# Patient Record
Sex: Female | Born: 1990 | Race: Black or African American | Hispanic: No | Marital: Single | Smoking: Never smoker
Health system: Southern US, Community
[De-identification: ages and names within clinical notes are randomized; demographics above are authoritative.]

---
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
## 2014-06-21 ENCOUNTER — Encounter: Payer: Self-pay | Admitting: Internal Medicine

## 2014-06-24 ENCOUNTER — Ambulatory Visit (INDEPENDENT_AMBULATORY_CARE_PROVIDER_SITE_OTHER): Payer: BLUE CROSS/BLUE SHIELD | Admitting: Internal Medicine

## 2014-06-24 DIAGNOSIS — Z23 Encounter for immunization: Secondary | ICD-10-CM | POA: Diagnosis not present

## 2014-06-24 DIAGNOSIS — Z9189 Other specified personal risk factors, not elsewhere classified: Secondary | ICD-10-CM

## 2014-06-24 MED ORDER — CIPROFLOXACIN HCL 500 MG PO TABS: 500.0000 mg | ORAL_TABLET | Freq: Two times a day (BID) | ORAL | Status: DC

## 2014-06-24 MED ORDER — ATOVAQUONE-PROGUANIL HCL 250-100 MG PO TABS: 1.0000 | ORAL_TABLET | Freq: Every day | ORAL | Status: DC

## 2014-07-01 NOTE — Progress Notes (Signed)
   Subjective:    Patient ID: Madison Mcgee, female    DOB: February 01, 1990, 24 y.o.   MRN: [id number]  HPI 24 yo F going to [place] through school program for 17 days. In good state of health.  No Known Allergies No current outpatient prescriptions on file prior to visit.   No current facility-administered medications on file prior to visit.      Review of Systems     Objective:   Physical Exam        Assessment & Plan:  Travel vaccines = will give hep A #1 as well as typhoid vaccination  Malaria prophylaxis = will give rx for malarone due to areas of travel maybe endemic  Traveler's diarrhea = gave rx for cipro to use if needed

## 2018-07-25 ENCOUNTER — Other Ambulatory Visit: Payer: Self-pay | Admitting: *Deleted

## 2018-07-25 DIAGNOSIS — Z20822 Contact with and (suspected) exposure to covid-19: Secondary | ICD-10-CM

## 2018-07-30 LAB — NOVEL CORONAVIRUS, NAA: SARS-CoV-2, NAA: NOT DETECTED

## 2019-05-14 ENCOUNTER — Other Ambulatory Visit: Payer: Self-pay | Admitting: Obstetrics and Gynecology

## 2019-05-14 DIAGNOSIS — R102 Pelvic and perineal pain: Secondary | ICD-10-CM

## 2019-05-18 ENCOUNTER — Ambulatory Visit
Admission: RE | Admit: 2019-05-18 | Discharge: 2019-05-18 | Disposition: A | Discharge status: Home / Self Care | Payer: No Typology Code available for payment source | Source: Ambulatory Visit | Attending: Obstetrics and Gynecology | Admitting: Obstetrics and Gynecology

## 2019-05-18 DIAGNOSIS — R102 Pelvic and perineal pain: Secondary | ICD-10-CM

## 2019-12-25 ENCOUNTER — Other Ambulatory Visit: Payer: BLUE CROSS/BLUE SHIELD

## 2019-12-25 ENCOUNTER — Other Ambulatory Visit: Payer: Self-pay

## 2019-12-25 DIAGNOSIS — Z20822 Contact with and (suspected) exposure to covid-19: Secondary | ICD-10-CM

## 2019-12-27 LAB — SARS-COV-2, NAA 2 DAY TAT

## 2019-12-27 LAB — NOVEL CORONAVIRUS, NAA: SARS-CoV-2, NAA: NOT DETECTED

## 2019-12-27 LAB — SPECIMEN STATUS REPORT

## 2020-07-01 IMAGING — US US PELVIS COMPLETE WITH TRANSVAGINAL
1 series · 14 of 25 positions shown · non-contrast
Comparison: None

CLINICAL DATA: Perineal neuralgia of unspecified laterality,
vaginal pain at for 4-5 months, LMP 05/02/2019

EXAM:
TRANSABDOMINAL AND TRANSVAGINAL ULTRASOUND OF PELVIS
TECHNIQUE: Both transabdominal and transvaginal ultrasound examinations of the
pelvis were performed. Transabdominal technique was performed for
global imaging of the pelvis including uterus, ovaries, adnexal
regions, and pelvic cul-de-sac. It was necessary to proceed with
endovaginal exam following the transabdominal exam to visualize the
endometrium and ovaries.

[Series 1: us pelvis complete with transvaginal · 0.20mm/px · 14 of 58 slices shown]
[im 1/58]
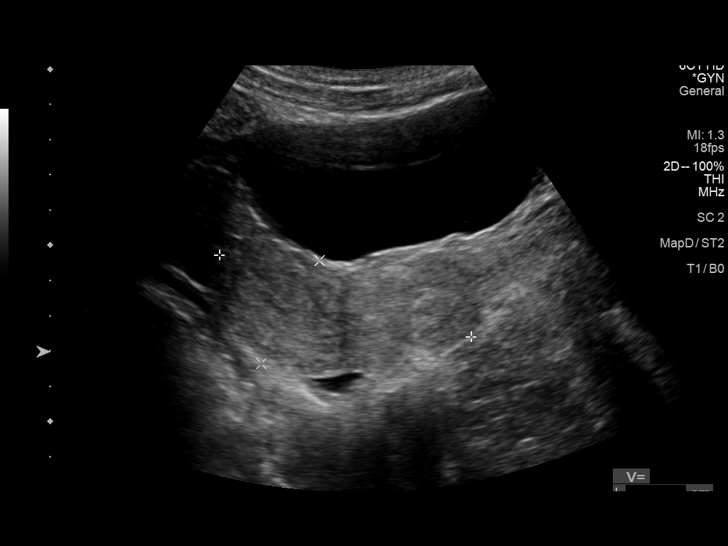
[im 5/58]
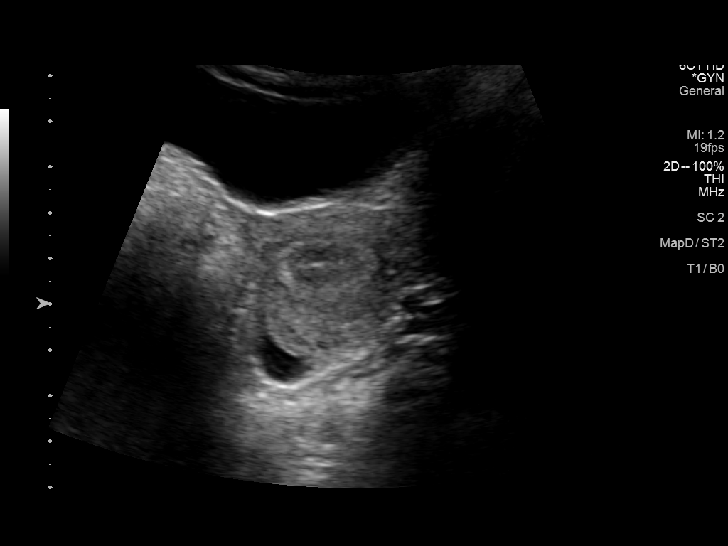
[im 10/58]
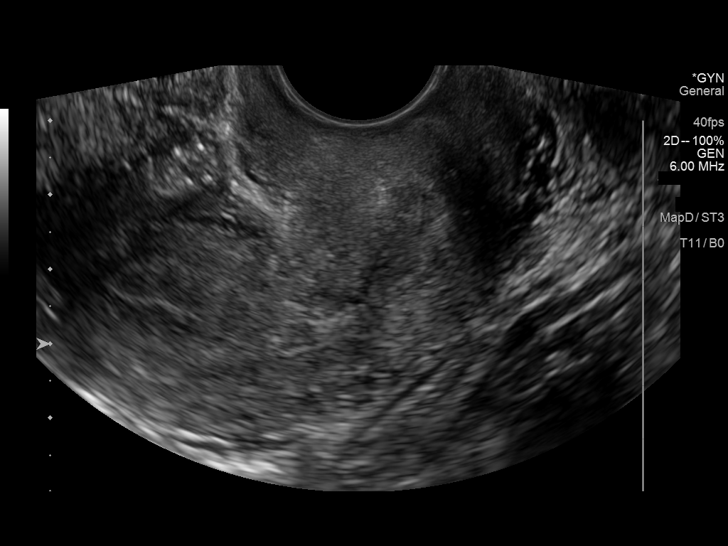
[im 15/58]
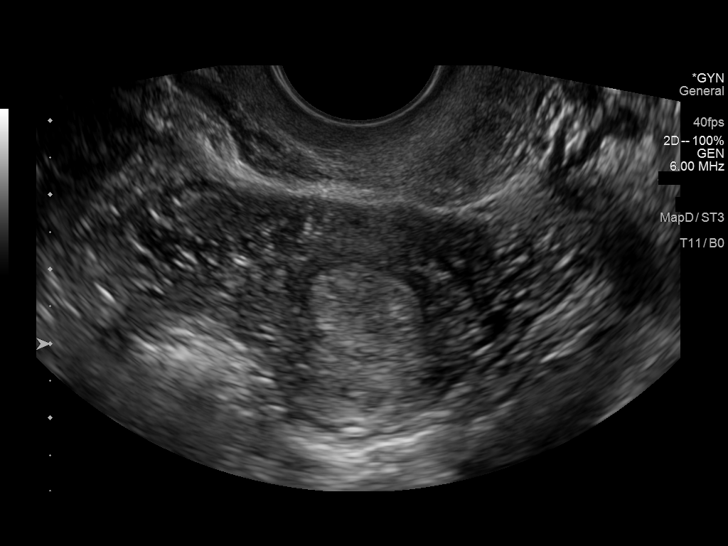
[im 20/58]
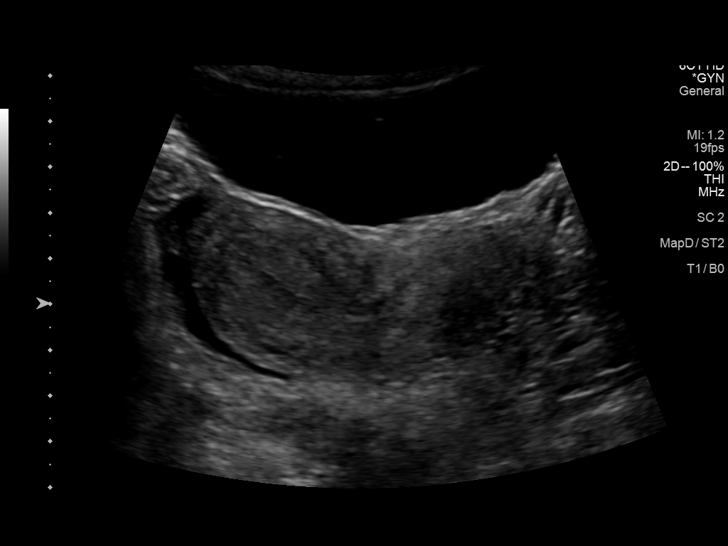
[im 22/58]
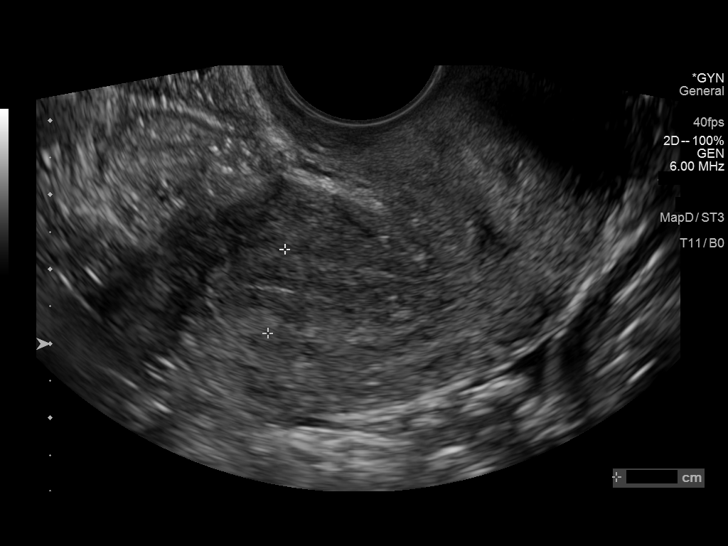
[im 27/58]
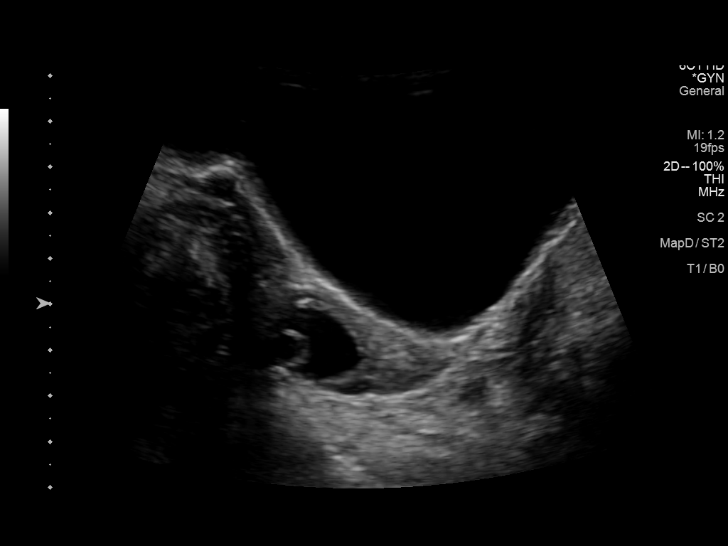
[im 31/58]
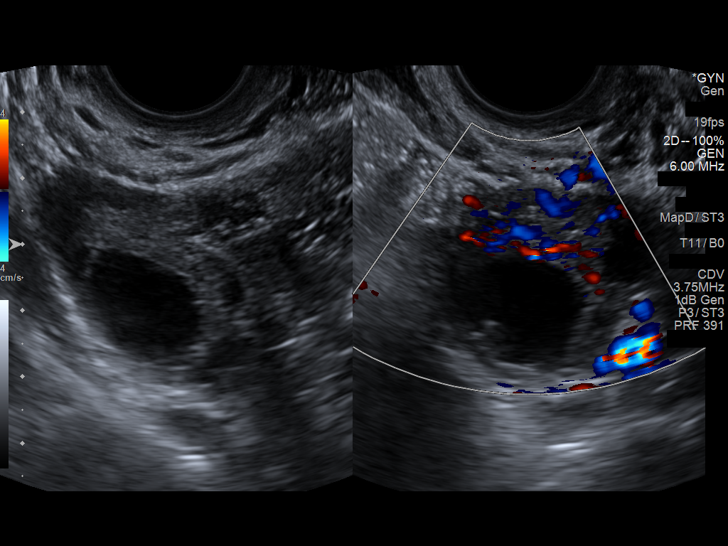
[im 36/58]
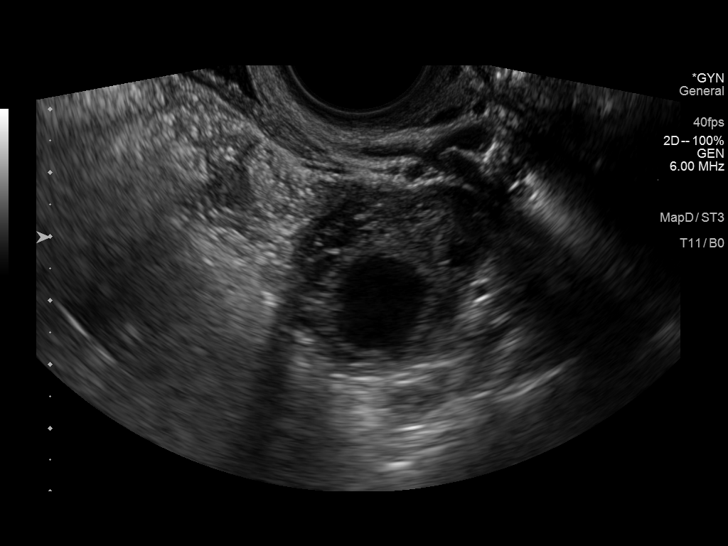
[im 39/58]
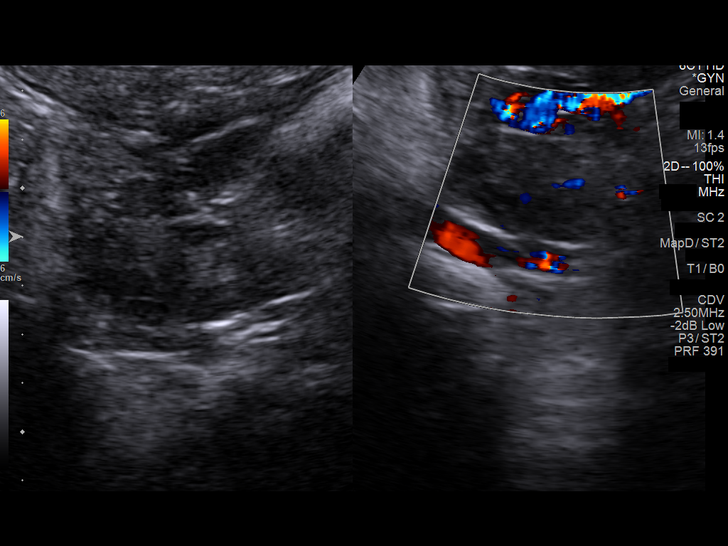
[im 43/58]
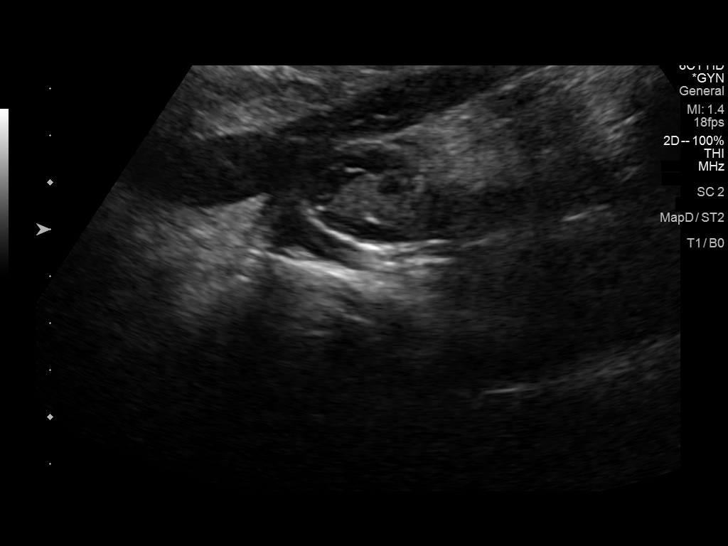
[im 48/58]
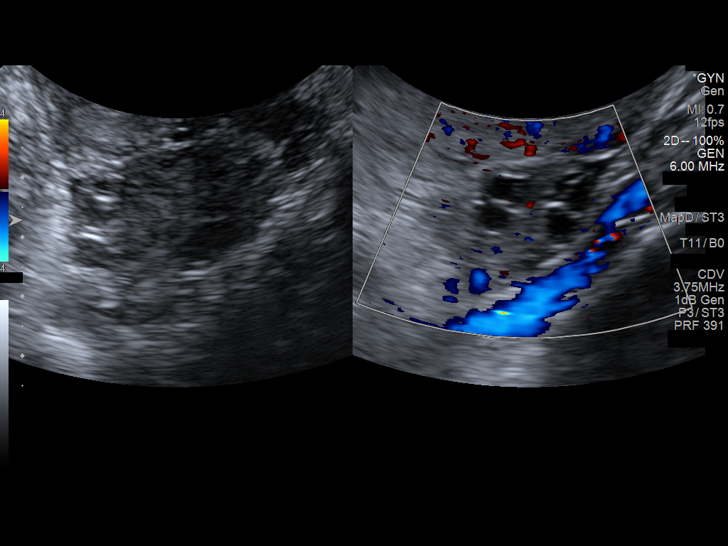
[im 53/58]
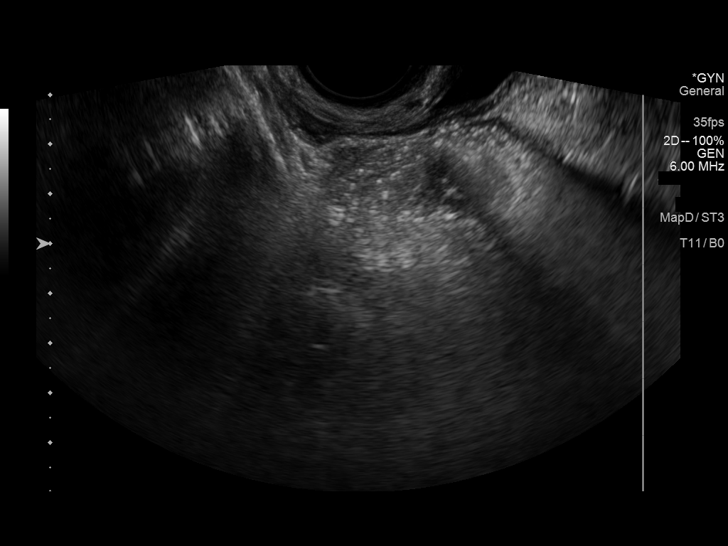
[im 58/58]
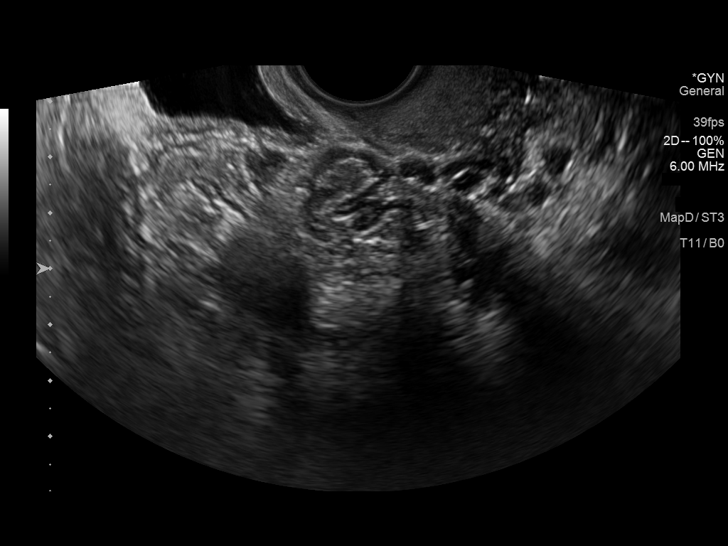

[14 of 25 positions shown; findings below may reference images not displayed]

FINDINGS: Uterus

Measurements: 7.5 x 3.4 x 4.7 cm = volume: 62 mL. Anteverted. Mildly
heterogeneous myometrium. No focal mass.

Endometrium

Thickness: 12 mm.  No endometrial fluid or focal abnormality

Right ovary

Measurements: 3.5 x 2.1 x 4.0 cm = volume: 14 mL. Dominant follicle
without mass

Left ovary

Measurements: 4.4 x 2.6 x 3.0 cm = volume: 18 mL. Normal morphology
without mass.

Other findings

Trace free pelvic fluid.  No adnexal masses.
IMPRESSION: No pelvic sonographic abnormalities identified.

## 2020-07-21 ENCOUNTER — Encounter: Payer: Self-pay | Admitting: Allergy

## 2020-07-21 ENCOUNTER — Ambulatory Visit: Payer: BC Managed Care – PPO | Admitting: Allergy

## 2020-07-21 ENCOUNTER — Other Ambulatory Visit: Payer: Self-pay

## 2020-07-21 VITALS — BP 88/50 | HR 106 | Temp 98.8°F | Resp 18 | Ht 67.0 in | Wt 105.0 lb

## 2020-07-21 DIAGNOSIS — Z8489 Family history of other specified conditions: Secondary | ICD-10-CM | POA: Diagnosis not present

## 2020-07-21 DIAGNOSIS — H1013 Acute atopic conjunctivitis, bilateral: Secondary | ICD-10-CM

## 2020-07-21 DIAGNOSIS — J3089 Other allergic rhinitis: Secondary | ICD-10-CM

## 2020-07-21 DIAGNOSIS — Z8709 Personal history of other diseases of the respiratory system: Secondary | ICD-10-CM | POA: Diagnosis not present

## 2020-07-21 DIAGNOSIS — K9049 Malabsorption due to intolerance, not elsewhere classified: Secondary | ICD-10-CM

## 2020-07-21 MED ORDER — OLOPATADINE HCL 0.2 % OP SOLN: 1.0000 [drp] | Freq: Every day | OPHTHALMIC | 3 refills | Status: AC | PRN

## 2020-07-21 MED ORDER — ALBUTEROL SULFATE HFA 108 (90 BASE) MCG/ACT IN AERS: 2.0000 | INHALATION_SPRAY | RESPIRATORY_TRACT | 1 refills | Status: AC | PRN

## 2020-07-21 MED ORDER — EPINEPHRINE 0.3 MG/0.3ML IJ SOAJ: 0.3000 mg | INTRAMUSCULAR | 2 refills | Status: AC | PRN

## 2020-07-21 NOTE — Assessment & Plan Note (Signed)
Noted bloating, acne, dry hair and fatigue with gluten. Less severe symptoms with dairy. Denies any other associated symptoms.  Discussed with patient at length that skin prick testing and bloodwork (food IgE levels) check for IgE mediated reactions which her clinical presentation does not support.   No indication for any food skin testing today.   You may continue to avoid gluten and dairy if it makes you feel better.

## 2020-07-21 NOTE — Assessment & Plan Note (Signed)
History of exercise induced asthma and used to have albuterol. Noted that breathing is not back to baseline since had Covid-19.  Today's spirometry was normal with no improvement in FEV1 post bronchodilator treatment. Clinically feeling unchanged. . May use albuterol rescue inhaler 2 puffs every 4 to 6 hours as needed for shortness of breath, chest tightness, coughing, and wheezing. May use albuterol rescue inhaler 2 puffs 5 to 15 minutes prior to strenuous physical activities. Monitor frequency of use.

## 2020-07-21 NOTE — Progress Notes (Signed)
il  New Patient Note  RE: Madison Mcgee MRN: [id number] DOB: 02-10-1990 Date of Office Visit: 07/21/2020  Consult requested by: No ref. provider found Primary care provider: Suella Broad, FNP  Chief Complaint: Allergy Testing (Wants to see if she is allergic to bee venom./Wants to be tested for environmental and foods) and Asthma (Wants to know if she still has asthma. Was diagnosed when a chid but rarely has issues as an adult)  History of Present Illness: I had the pleasure of seeing Madison Mcgee for initial evaluation at the [facility] on 07/21/2020. She is a 29 y.o. female, who is self-referred here for the evaluation of family history of bee sting allergy, allergies and history of asthma.  Bee sting Patient has never been stung before but her father breaks out in hives and apparently had "anaphylaxis" and carries and Epipen for this. Mother is concerned about reaction after a bee sting in the patient.   Asthma: She reports symptoms of chest tightness, shortness of breath, coughing, wheezing for many years. Current medications include none. She reports not using aerochamber with inhalers. She tried the following inhalers: albuterol. Main triggers are exercise. In the last month, frequency of symptoms: sometimes. Frequency of nocturnal symptoms: 0x/month. Frequency of SABA use: 0x/week. Interference with physical activity: sometimes. Sleep is undisturbed. In the last 12 months, emergency room visits/urgent care visits/doctor office visits or hospitalizations due to respiratory issues: no. In the last 12 months, oral steroids courses: no. Lifetime history of hospitalization for respiratory issues: no. Prior intubations: on. History of pneumonia: no. She was evaluated by allergist in the past. Smoking exposure: no. Up to date with flu vaccine: yes. Up to date with COVID-19 vaccine: yes. Prior Covid-19 infection: yes and noted that her breathing is not back to baseline  since then.  History of reflux: no.  Rhinitis: She reports symptoms of coughing, sneezing, watery/itchy eyes, rhinorrhea, PND Symptoms have been going on for many years. The symptoms are present all year around with worsening in spring and summer. Anosmia: no. Headache: yes. She has used zyrtec, allegra, Claritin, visine eye drops with some improvement in symptoms. Sinus infections: no. Previous work up includes: at age 18 which was positive to dogs, cats, dust mites and tree pollen per patient report. No prior AIT. Previous ENT evaluation: not recently. Previous sinus imaging: no. History of nasal polyps: no. Last eye exam: 2019.  Assessment and Plan: Madison is a 30 y.o. female with: Other allergic rhinitis Rhino conjunctivitis symptoms mainly in the spring and summer. Tried OTC antihistamines with good benefit. Skin testing 10+ years ago showed multiple positives per patient report. No prior AIT. Today's skin testing showed: Positive to grass, trees. Start environmental control measures as below. Use over the counter antihistamines such as Zyrtec (cetirizine), Claritin (loratadine), Allegra (fexofenadine), or Xyzal (levocetirizine) daily as needed. May take twice a day during allergy flares. May switch antihistamines every few months. Use olopatadine eye drops 0.2% once a day as needed for itchy/watery eyes.  Allergic conjunctivitis of both eyes See assessment and plan as above.  History of asthma History of exercise induced asthma and used to have albuterol. Noted that breathing is not back to baseline since had Covid-19. Today's spirometry was normal with no improvement in FEV1 post bronchodilator treatment. Clinically feeling unchanged. May use albuterol rescue inhaler 2 puffs every 4 to 6 hours as needed for shortness of breath, chest tightness, coughing, and wheezing. May use albuterol rescue inhaler  2 puffs 5 to 15 minutes prior to strenuous physical activities. Monitor frequency of  use.   Food intolerance Noted bloating, acne, dry hair and fatigue with gluten. Less severe symptoms with dairy. Denies any other associated symptoms. Discussed with patient at length that skin prick testing and bloodwork (food IgE levels) check for IgE mediated reactions which her clinical presentation does not support.  No indication for any food skin testing today.  You may continue to avoid gluten and dairy if it makes you feel better.   Family history of allergies Patient's father has "anaphylaxis" with bee stings and carries an Epipen. Patient has never been stung by hymenoptera stinging insect before. Mother concerned about the possibility of allergic reaction. Discussed with patient that bloodwork and skin testing for hymenoptera panel will most likely be negative as she never had a prior exposure. Allergy testing does not predict future allergies.  Patient is okay with not getting bloodwork at this time however she and her mother would like to have an Epipen on hand just in case - discussed that this is not necessary but if it puts their mind more at ease then I will prescribe one.  I have prescribed epinephrine injectable and demonstrated proper use. For mild symptoms you can take over the counter antihistamines such as Benadryl and monitor symptoms closely. If symptoms worsen or if you have severe symptoms including breathing issues, throat closure, significant swelling, whole body hives, severe diarrhea and vomiting, lightheadedness then inject epinephrine and seek immediate medical care afterwards. Action plan given.   Return in about 8 months (around 03/21/2021).  Meds ordered this encounter  Medications   EPINEPHrine 0.3 mg/0.3 mL IJ SOAJ injection    Sig: Inject 0.3 mg into the muscle as needed for anaphylaxis.    Dispense:  1 each    Refill:  2   albuterol (VENTOLIN HFA) 108 (90 Base) MCG/ACT inhaler    Sig: Inhale 2 puffs into the lungs every 4 (four) hours as needed for  wheezing or shortness of breath.    Dispense:  18 g    Refill:  1   Olopatadine HCl 0.2 % SOLN    Sig: Apply 1 drop to eye daily as needed (itchy/watery eyes).    Dispense:  2.5 mL    Refill:  3    Lab Orders  No laboratory test(s) ordered today    Other allergy screening: Food allergy:  currently avoiding gluten. Gluten caused bloating, acne, dry hair. Noticed improvement since stopping gluten. Also trying to limit dairy.  Medication allergy: no Urticaria: no Eczema:no History of recurrent infections suggestive of immunodeficency: no  Diagnostics: Spirometry:  Tracings reviewed. Her effort: Good reproducible efforts. FVC: 3.05L FEV1: 2.75L, 90% predicted FEV1/FVC ratio: 90% Interpretation: Spirometry consistent with normal pattern with no improvement in FEV1 post bronchodilator treatment. Clinically feeling unchanged.   Please see scanned spirometry results for details.  Skin Testing: Environmental allergy panel. Positive to grass, trees. Results discussed with patient/family.  Airborne Adult Perc - 07/21/20 1453     Time Antigen Placed 1453    Allergen Manufacturer Lavella Hammock    Location Back    Number of Test 59    1. Control-Buffer 50% Glycerol Negative    2. Control-Histamine 1 mg/ml 2+    3. Albumin saline Negative    4. [place] Negative    5. [place] 2+    6. [place] 2+    7. [place] --   +/-   8. Meadow Fescue  Negative    9. Perennial Rye Negative    10. Sweet Vernal Negative    11. Timothy Negative    12. Cocklebur Negative    13. Burweed Marshelder Negative    14. Ragweed, short Negative    15. Ragweed, Giant Negative    16. Plantain,  English Negative    17. Lamb's Quarters Negative    18. Sheep Sorrell Negative    19. Rough Pigweed Negative    20. Marsh Elder, Rough Negative    21. Mugwort, Common Negative    22. Ash mix Negative    23. Birch mix Negative    24. Beech American Negative    25. Box, Elder 2+    26. Cedar, red Negative     27. Cottonwood, [place] Negative    28. Elm mix Negative    29. Hickory 2+    30. Maple mix 2+    31. Oak, [place] mix Negative    32. Pecan Pollen Negative    33. Pine mix Negative    34. Sycamore Eastern Negative    35. Mine [place], Black Pollen 2+    36. Alternaria alternata Negative    37. Cladosporium Herbarum Negative    38. Aspergillus mix Negative    39. Penicillium mix Negative    40. Bipolaris sorokiniana (Helminthosporium) Negative    41. Drechslera spicifera (Curvularia) Negative    42. Mucor plumbeus Negative    43. Fusarium moniliforme Negative    44. Aureobasidium pullulans (pullulara) Negative    45. Rhizopus oryzae Negative    46. Botrytis cinera Negative    47. Epicoccum nigrum Negative    48. Phoma betae Negative    49. Candida Albicans Negative    50. Trichophyton mentagrophytes Negative    51. Mite, D Farinae  5,000 AU/ml Negative    52. Mite, D Pteronyssinus  5,000 AU/ml Negative    53. Cat Hair 10,000 BAU/ml Negative    54.  Dog Epithelia Negative    55. Mixed Feathers Negative    56. Horse Epithelia Negative    57. Cockroach, German Negative    58. Mouse Negative    59. Tobacco Leaf Negative             Past Medical History: Patient Active Problem List   Diagnosis Date Noted   Other allergic rhinitis 07/21/2020   Allergic conjunctivitis of both eyes 07/21/2020   History of asthma 07/21/2020   Family history of allergies 07/21/2020   Food intolerance 07/21/2020    History reviewed. No pertinent past medical history. Past Surgical History: History reviewed. No pertinent surgical history. Medication List:  Current Outpatient Medications  Medication Sig Dispense Refill   albuterol (VENTOLIN HFA) 108 (90 Base) MCG/ACT inhaler Inhale 2 puffs into the lungs every 4 (four) hours as needed for wheezing or shortness of breath. 18 g 1   EPINEPHrine 0.3 mg/0.3 mL IJ SOAJ injection Inject 0.3 mg into the muscle as needed for anaphylaxis. 1 each 2   ID  NOW COVID-19 KIT TEST AS DIRECTED TODAY     Olopatadine HCl 0.2 % SOLN Apply 1 drop to eye daily as needed (itchy/watery eyes). 2.5 mL 3   spironolactone (ALDACTONE) 50 MG tablet spironolactone 50 mg tablet   1 tablet every day by oral route.     thyroid (ARMOUR) 60 MG tablet NP Thyroid 60 mg tablet  Take 1 tablet every day by oral route.     tretinoin (RETIN-A) 0.025 % cream tretinoin 0.025 %  topical cream     No current facility-administered medications for this visit.   Allergies: No Known Allergies Social History: Social History   Socioeconomic History   Marital status: Single    Spouse name: Not on file   Number of children: Not on file   Years of education: Not on file   Highest education level: Not on file  Occupational History   Not on file  Tobacco Use   Smoking status: Never   Smokeless tobacco: Never  Substance and Sexual Activity   Alcohol use: No   Drug use: No   Sexual activity: Never  Other Topics Concern   Not on file  Social History Narrative   Not on file   Social Determinants of Health   Financial Resource Strain: Not on file  Food Insecurity: Not on file  Transportation Needs: Not on file  Physical Activity: Not on file  Stress: Not on file  Social Connections: Not on file   Lives in a 28 year old house. Smoking: denies Occupation: Landscape architect HistoryFreight forwarder in the house: yes Carpet in the family room: yes Carpet in the bedroom: yes Heating: gas Cooling: central Pet: no  Family History: History reviewed. No pertinent family history. Problem                               Relation Asthma                                   No  Eczema                                No  Food allergy                          Mother   Allergic rhino conjunctivitis     Mother   Review of Systems  Constitutional:  Negative for appetite change, chills, fever and unexpected weight change.  HENT:  Positive for congestion, postnasal  drip and rhinorrhea.   Eyes:  Positive for itching.  Respiratory:  Positive for shortness of breath. Negative for cough, chest tightness and wheezing.   Cardiovascular:  Negative for chest pain.  Gastrointestinal:  Negative for abdominal pain.  Genitourinary:  Negative for difficulty urinating.  Skin:  Negative for rash.  Allergic/Immunologic: Positive for environmental allergies.  Neurological:  Positive for headaches.   Objective: BP (!) 88/50   Pulse (!) 106   Temp 98.8 F (37.1 C) (Temporal)   Resp 18   Ht _0  (1.702 m)   Wt 105 lb (47.6 kg)   SpO2 98%   BMI 16.45 kg/m  Body mass index is 16.45 kg/m. Physical Exam Vitals and nursing note reviewed.  Constitutional:      Appearance: Normal appearance. She is well-developed.  HENT:     Head: Normocephalic and atraumatic.     Right Ear: External ear normal.     Left Ear: External ear normal.     Nose: Nose normal.     Mouth/Throat:     Mouth: Mucous membranes are moist.     Pharynx: Oropharynx is clear.  Eyes:     Conjunctiva/sclera: Conjunctivae normal.  Cardiovascular:     Rate and Rhythm: Normal rate and regular rhythm.  Heart sounds: Normal heart sounds. No murmur heard.   No friction rub. No gallop.  Pulmonary:     Effort: Pulmonary effort is normal.     Breath sounds: Normal breath sounds. No wheezing, rhonchi or rales.  Abdominal:     Palpations: Abdomen is soft.  Musculoskeletal:     Cervical back: Neck supple.  Skin:    General: Skin is warm.     Findings: No rash.  Neurological:     Mental Status: She is alert and oriented to person, place, and time.  Psychiatric:        Behavior: Behavior normal.  The plan was reviewed with the patient/family, and all questions/concerned were addressed.  It was my pleasure to see Madison today and participate in her care. Please feel free to contact me with any questions or concerns.  Sincerely,  Rexene Alberts, DO Allergy & Immunology  [facility] office: [place] office: [phone]

## 2020-07-21 NOTE — Assessment & Plan Note (Signed)
.   See assessment and plan as above. 

## 2020-07-21 NOTE — Patient Instructions (Addendum)
Today's skin testing showed: Positive to grass, trees.  History of bee sting reaction in family I don't think you need an Epipen but if it will make you and your mother feel more comfortable then I will prescribe it. The bloodwork will most likely be negative as you have never been stung before.  I have prescribed epinephrine injectable and demonstrated proper use. For mild symptoms you can take over the counter antihistamines such as Benadryl and monitor symptoms closely. If symptoms worsen or if you have severe symptoms including breathing issues, throat closure, significant swelling, whole body hives, severe diarrhea and vomiting, lightheadedness then inject epinephrine and seek immediate medical care afterwards. Action plan given.   Environmental allergies Start environmental control measures as below. Use over the counter antihistamines such as Zyrtec (cetirizine), Claritin (loratadine), Allegra (fexofenadine), or Xyzal (levocetirizine) daily as needed. May take twice a day during allergy flares. May switch antihistamines every few months. Use olopatadine eye drops 0.2% once a day as needed for itchy/watery eyes.  Asthma: May use albuterol rescue inhaler 2 puffs every 4 to 6 hours as needed for shortness of breath, chest tightness, coughing, and wheezing. May use albuterol rescue inhaler 2 puffs 5 to 15 minutes prior to strenuous physical activities. Monitor frequency of use.   Food: Discussed with patient at length that skin prick testing and bloodwork (food IgE levels) check for IgE mediated reactions which her clinical presentation does not support.  You may continue to avoid gluten and dairy if it makes you feel better.   Follow up in 8 months or sooner if needed.   Reducing Pollen Exposure Pollen seasons: trees (spring), grass (summer) and ragweed/weeds (fall). Keep windows closed in your home and car to lower pollen exposure.  Install air conditioning in the bedroom and  throughout the house if possible.  Avoid going out in dry windy days - especially early morning. Pollen counts are highest between 5 - 10 AM and on dry, hot and windy days.  Save outside activities for late afternoon or after a heavy rain, when pollen levels are lower.  Avoid mowing of grass if you have grass pollen allergy. Be aware that pollen can also be transported indoors on people and pets.  Dry your clothes in an automatic dryer rather than hanging them outside where they might collect pollen.  Rinse hair and eyes before bedtime.

## 2020-07-21 NOTE — Assessment & Plan Note (Signed)
Patient's father has "anaphylaxis" with bee stings and carries an Epipen. Patient has never been stung by hymenoptera stinging insect before. Mother concerned about the possibility of allergic reaction.  Discussed with patient that bloodwork and skin testing for hymenoptera panel will most likely be negative as she never had a prior exposure. Allergy testing does not predict future allergies.   Patient is okay with not getting bloodwork at this time however she and her mother would like to have an Epipen on hand just in case - discussed that this is not necessary but if it puts their mind more at ease then I will prescribe one.  . I have prescribed epinephrine injectable and demonstrated proper use. For mild symptoms you can take over the counter antihistamines such as Benadryl and monitor symptoms closely. If symptoms worsen or if you have severe symptoms including breathing issues, throat closure, significant swelling, whole body hives, severe diarrhea and vomiting, lightheadedness then inject epinephrine and seek immediate medical care afterwards. . Action plan given.

## 2020-07-21 NOTE — Assessment & Plan Note (Signed)
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 Tried OTC antihistamines with good benefit. Skin testing 10+ years ago showed multiple positives per patient report. No prior AIT.  Today's skin testing showed: Positive to grass, trees.  Start environmental control measures as below.  Use over the counter antihistamines such as Zyrtec (cetirizine), Claritin (loratadine), Allegra (fexofenadine), or Xyzal (levocetirizine) daily as needed. May take twice a day during allergy flares. May switch antihistamines every few months.  Use olopatadine eye drops 0.2% once a day as needed for itchy/watery eyes.

## 2021-03-23 ENCOUNTER — Ambulatory Visit: Payer: BC Managed Care – PPO | Admitting: Allergy
